# Patient Record
Sex: Male | Born: 1950 | Race: White | Hispanic: No | Marital: Married | Smoking: Never smoker
Health system: Southern US, Community
[De-identification: ages and names within clinical notes are randomized; demographics above are authoritative.]

## PROBLEM LIST (undated history)

## (undated) DIAGNOSIS — H9190 Unspecified hearing loss, unspecified ear: Secondary | ICD-10-CM

## (undated) DIAGNOSIS — M1990 Unspecified osteoarthritis, unspecified site: Secondary | ICD-10-CM

## (undated) DIAGNOSIS — C801 Malignant (primary) neoplasm, unspecified: Secondary | ICD-10-CM

## (undated) DIAGNOSIS — K219 Gastro-esophageal reflux disease without esophagitis: Secondary | ICD-10-CM

## (undated) DIAGNOSIS — I10 Essential (primary) hypertension: Secondary | ICD-10-CM

## (undated) HISTORY — PX: PROSTATE SURGERY: SHX751

## (undated) HISTORY — PX: THYROIDECTOMY: SHX17

---

## 2006-10-30 ENCOUNTER — Ambulatory Visit: Payer: Self-pay | Admitting: Endocrinology

## 2006-11-25 ENCOUNTER — Ambulatory Visit: Payer: Self-pay | Admitting: Unknown Physician Specialty

## 2006-11-25 ENCOUNTER — Other Ambulatory Visit: Payer: Self-pay

## 2006-11-28 ENCOUNTER — Ambulatory Visit: Payer: Self-pay | Admitting: Unknown Physician Specialty

## 2006-12-01 ENCOUNTER — Ambulatory Visit: Payer: Self-pay | Admitting: Oncology

## 2006-12-04 ENCOUNTER — Ambulatory Visit: Payer: Self-pay | Admitting: Oncology

## 2006-12-31 ENCOUNTER — Ambulatory Visit: Payer: Self-pay | Admitting: Oncology

## 2007-01-05 ENCOUNTER — Ambulatory Visit: Payer: Self-pay | Admitting: Oncology

## 2007-01-09 ENCOUNTER — Ambulatory Visit: Payer: Self-pay | Admitting: Oncology

## 2007-01-31 ENCOUNTER — Ambulatory Visit: Payer: Self-pay | Admitting: Oncology

## 2007-03-02 ENCOUNTER — Ambulatory Visit: Payer: Self-pay | Admitting: Oncology

## 2007-05-15 ENCOUNTER — Ambulatory Visit: Payer: Self-pay | Admitting: Oncology

## 2007-05-31 ENCOUNTER — Ambulatory Visit: Payer: Self-pay | Admitting: Oncology

## 2007-08-12 ENCOUNTER — Ambulatory Visit: Payer: Self-pay | Admitting: Oncology

## 2007-08-31 ENCOUNTER — Ambulatory Visit: Payer: Self-pay | Admitting: Oncology

## 2007-10-31 ENCOUNTER — Ambulatory Visit: Payer: Self-pay | Admitting: Oncology

## 2007-11-11 ENCOUNTER — Ambulatory Visit: Payer: Self-pay | Admitting: Oncology

## 2007-12-01 ENCOUNTER — Ambulatory Visit: Payer: Self-pay | Admitting: Oncology

## 2007-12-11 ENCOUNTER — Ambulatory Visit: Payer: Self-pay | Admitting: Unknown Physician Specialty

## 2008-01-31 ENCOUNTER — Ambulatory Visit: Payer: Self-pay | Admitting: Oncology

## 2008-02-08 ENCOUNTER — Ambulatory Visit: Payer: Self-pay | Admitting: Oncology

## 2008-03-01 ENCOUNTER — Ambulatory Visit: Payer: Self-pay | Admitting: Oncology

## 2008-05-09 ENCOUNTER — Ambulatory Visit: Payer: Self-pay | Admitting: Oncology

## 2008-05-30 ENCOUNTER — Ambulatory Visit: Payer: Self-pay | Admitting: Oncology

## 2008-07-30 ENCOUNTER — Ambulatory Visit: Payer: Self-pay | Admitting: Oncology

## 2008-08-08 ENCOUNTER — Ambulatory Visit: Payer: Self-pay | Admitting: Oncology

## 2008-08-30 ENCOUNTER — Ambulatory Visit: Payer: Self-pay | Admitting: Oncology

## 2008-11-07 ENCOUNTER — Ambulatory Visit: Payer: Self-pay | Admitting: Oncology

## 2008-11-30 ENCOUNTER — Ambulatory Visit: Payer: Self-pay | Admitting: Oncology

## 2009-01-30 ENCOUNTER — Ambulatory Visit: Payer: Self-pay | Admitting: Oncology

## 2009-02-07 ENCOUNTER — Ambulatory Visit: Payer: Self-pay | Admitting: Oncology

## 2009-03-01 ENCOUNTER — Ambulatory Visit: Payer: Self-pay | Admitting: Oncology

## 2009-08-26 IMAGING — NM NUCLEAR MEDICINE 24 HOUR THYROID UPTAKE
7 series · 26 of 26 positions shown · non-contrast
Comparison: none

REASON FOR EXAM: thyroid CA
COMMENTS:

[Series 1000: (id) wb 72 hr (reformatted series) · 2.40mm/px · 2 of 2 frames shown]
[frame 1/2  full-range]
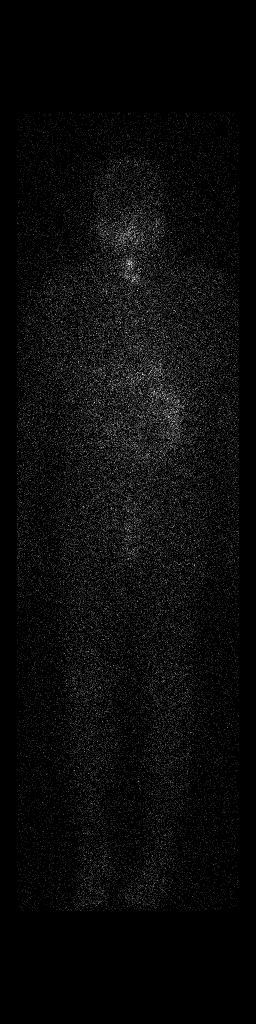
[frame 2/2  full-range]
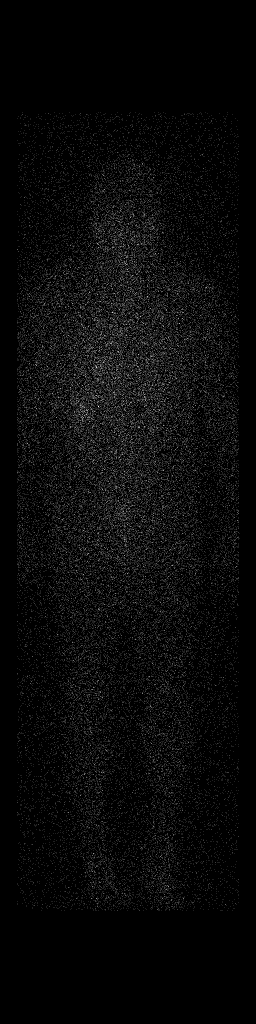

[Series 1000: (id) wb 24 hr · 2.40mm/px · 2 of 2 frames shown (1 of 2)]
[frame 1/2  full-range]
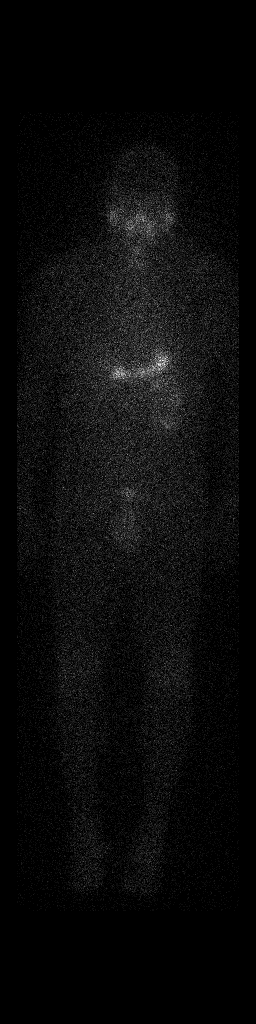
[frame 2/2  full-range]
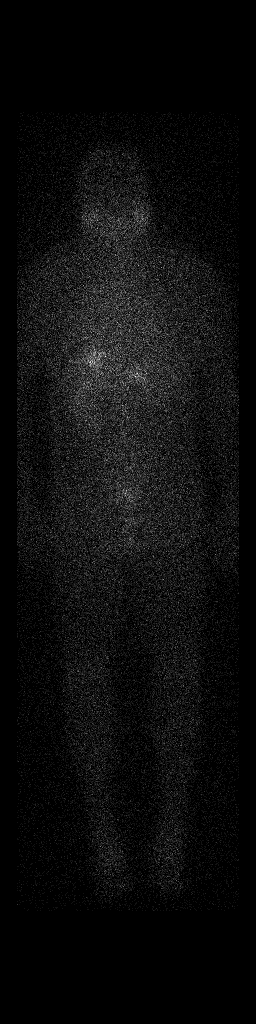

[Series 1000: (id) statics 24 hour · 2.40mm/px · 3 acquisitions, 6 frames shown (1 of 2)]
[im 1/3  full-range]
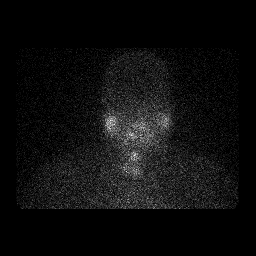
[im 1/3  full-range]
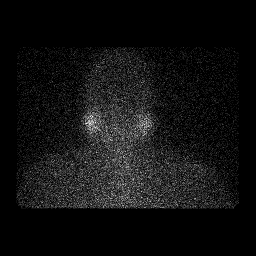
[im 2/3  full-range]
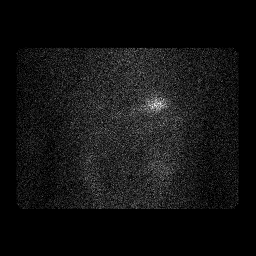
[im 2/3  full-range]
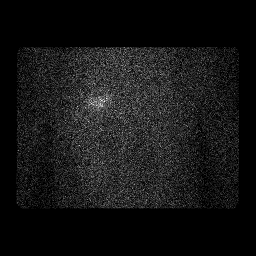
[im 3/3  full-range]
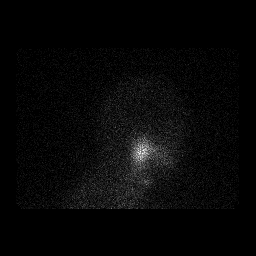
[im 3/3  full-range]
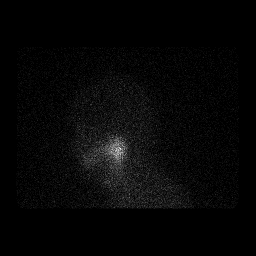

[Series 1000: (id) wb 24 hr · 2.40mm/px · 2 of 2 frames shown (2 of 2)]
[frame 1/2  full-range]
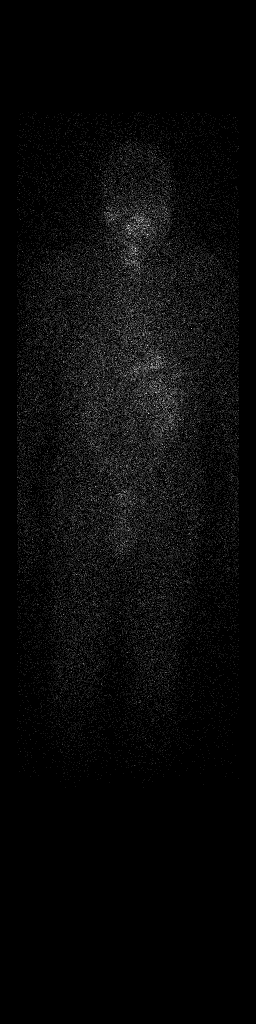
[frame 2/2  full-range]
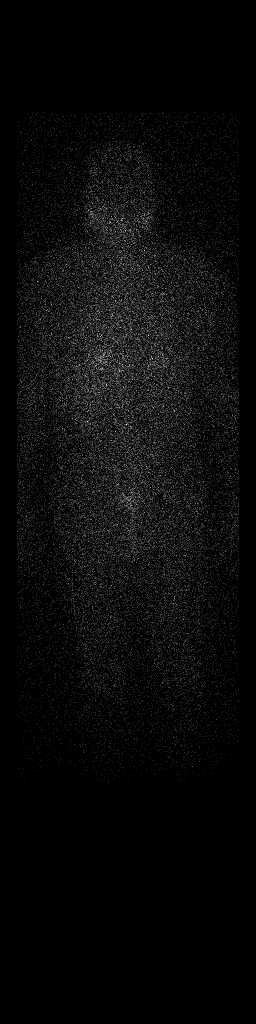

[Series 1000: (id) wb 72 hr · 2.40mm/px · 2 of 2 frames shown]
[frame 1/2  full-range]
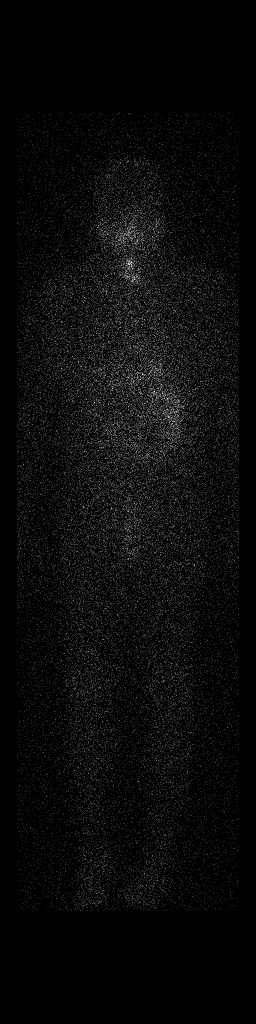
[frame 2/2  full-range]
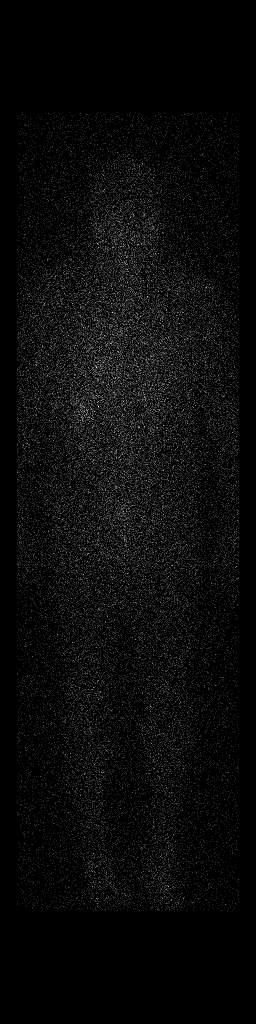

[Series 1000: (id) statics · 2.40mm/px · 4 acquisitions, 8 frames shown]
[im 1/4  full-range]
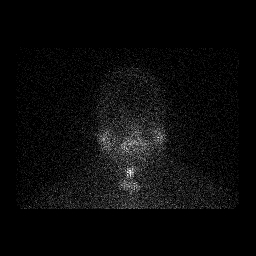
[im 1/4  full-range]
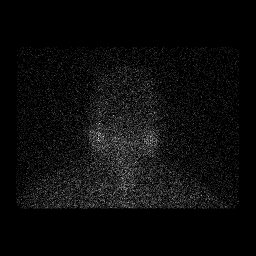
[im 2/4  full-range]
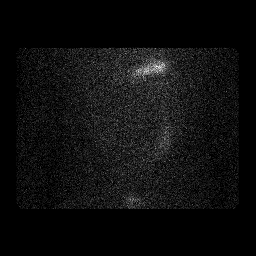
[im 2/4  full-range]
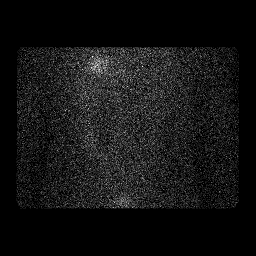
[im 3/4  full-range]
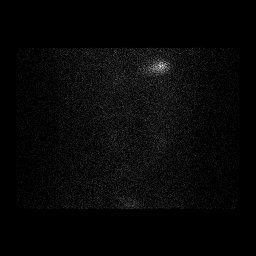
[im 3/4  full-range]
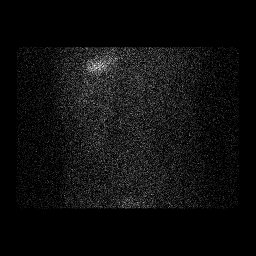
[im 4/4  full-range]
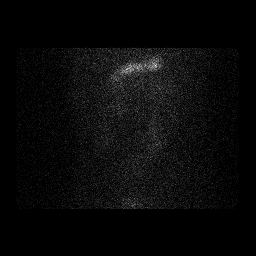
[im 4/4  full-range]
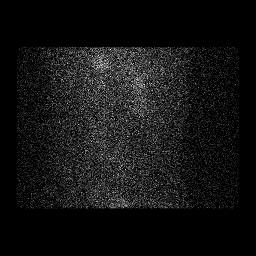

[Series 1000: (id) statics 24 hour · 2.40mm/px · 2 acquisitions, 4 frames shown (2 of 2)]
[im 1/2  full-range]
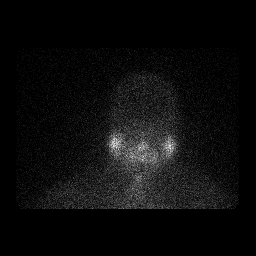
[im 1/2  full-range]
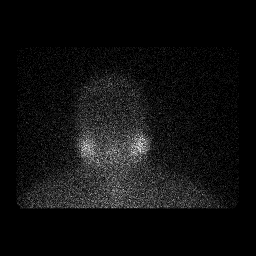
[im 2/2  full-range]
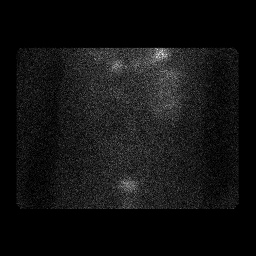
[im 2/2  full-range]
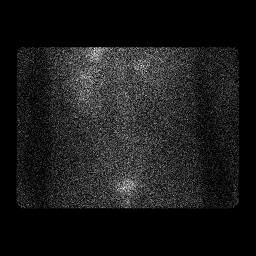

[26 of 26 positions shown; findings below may reference images not displayed]

PROCEDURE:     NM  - NM THYROID WB 72 HR [DATE]  [DATE]

RESULT:     The patient has a history of near-total thyroidectomy with two
foci of papillary carcinoma reported in the RIGHT lobe the largest of which
was 2 cm. There was no evidence of metastatic disease in the regional lymph
nodes or evidence of capsular or vascular invasion. The patient was given a
dose of 1.4 mCi of E-III. The patient's TSH level is elevated. The patient
has been on a low iodide diet.

Anterior and posterior whole-body imaging as well as regional images was
obtained at 24, 48 and 72 hours following E-III administration. The images
show residual activity in the area of the thyroid bed bilaterally. No
distant areas of abnormal localization were seen. There is normal GI and GU
localization. There is no bony localization evident.
IMPRESSION: No findings to suggest distant metastatic disease. There is
local activity in the area of the neck which could represent residual
thyroid tissue. The patient is scheduled for a therapy dose which will be
dictated separately.

## 2010-07-31 IMAGING — CT CT NECK WITH CONTRAST
1 of 2 series · 9 of 14 positions shown, 12 images · non-contrast
Comparison: none

REASON FOR EXAM: Parotitis
COMMENTS:

[Series 2: soft tissue · axial · 0.51mm/px · z∈[-358,-108]mm · 9 of 105 slices shown, 12 images]
[im 11/105  soft-tissue]
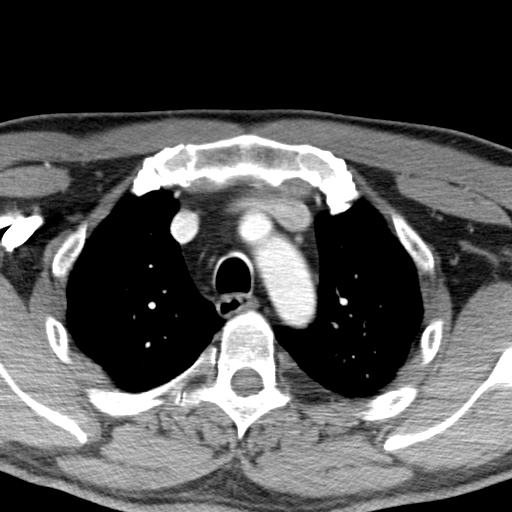
[im 11/105  bone]
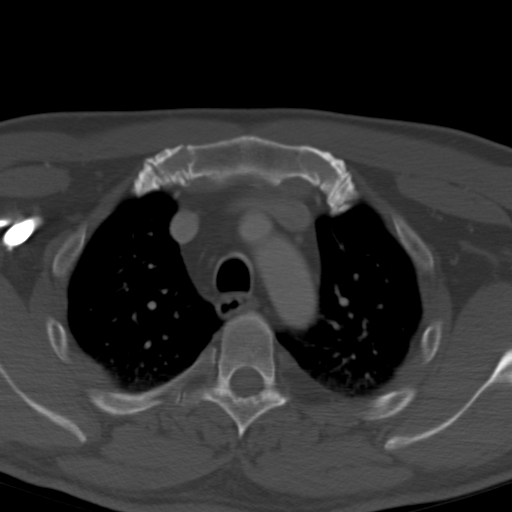
[im 21/105  bone]
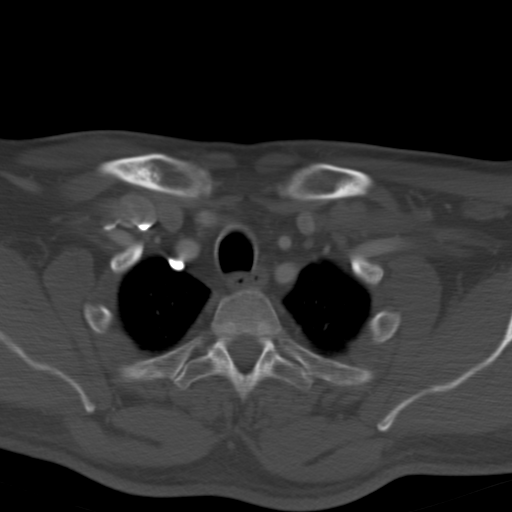
[im 32/105  bone]
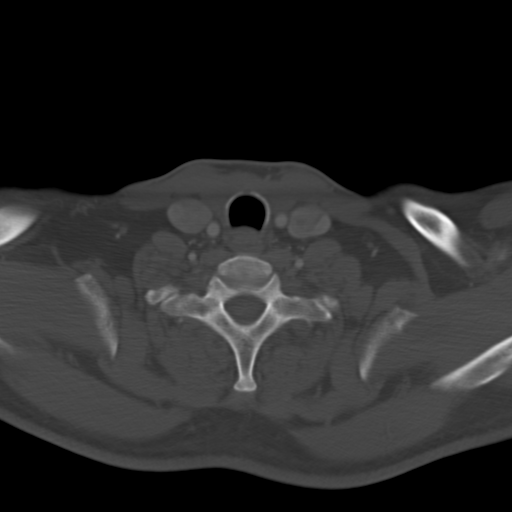
[im 42/105  bone]
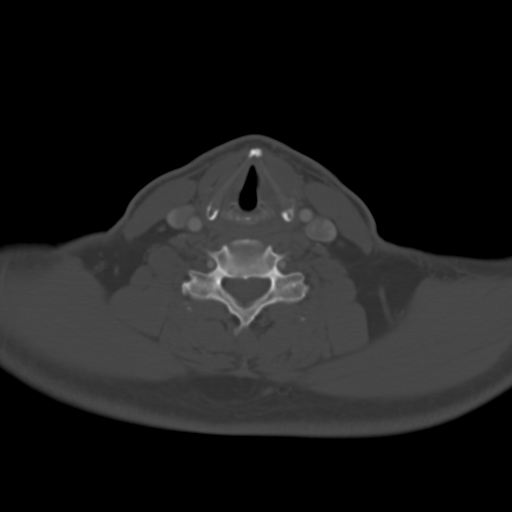
[im 53/105  soft-tissue]
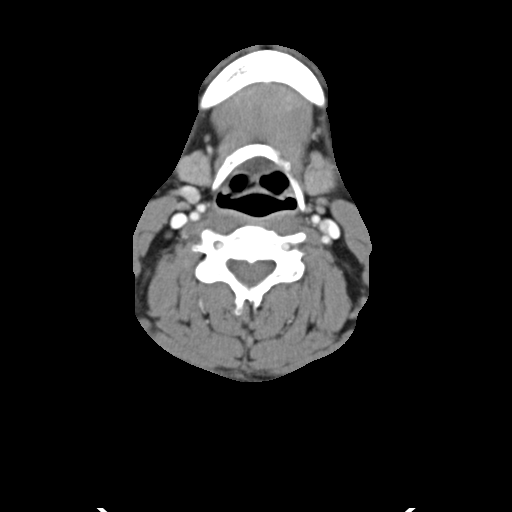
[im 53/105  bone]
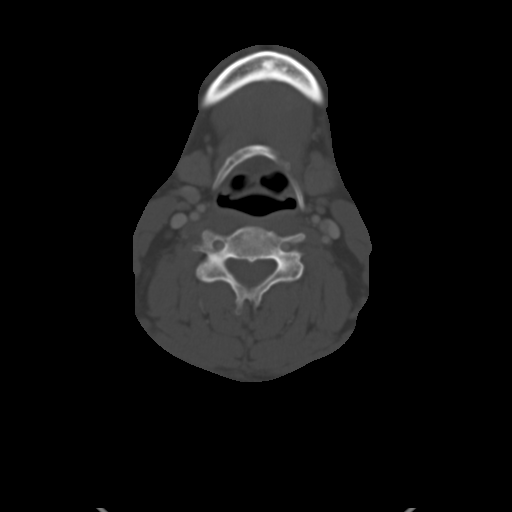
[im 63/105  bone]
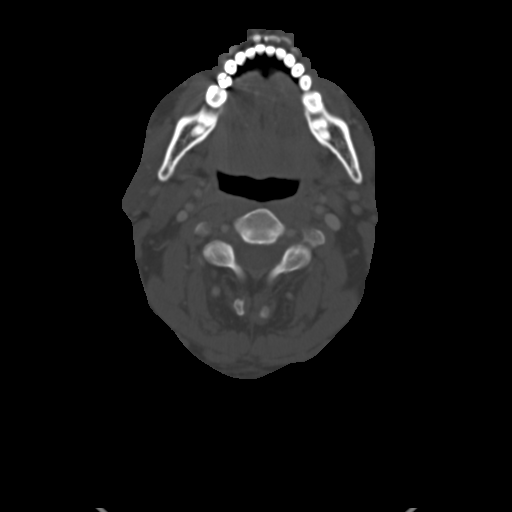
[im 73/105  bone]
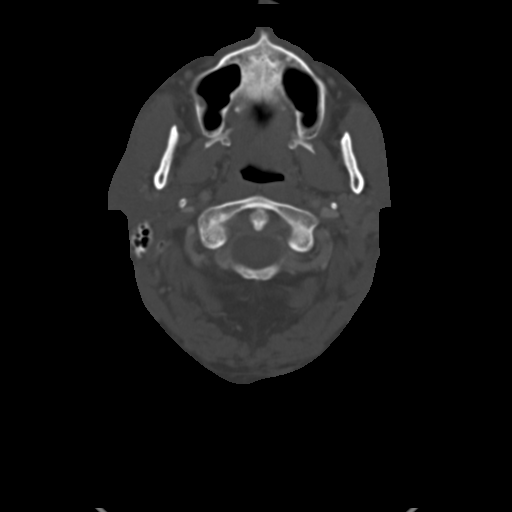
[im 84/105  bone]
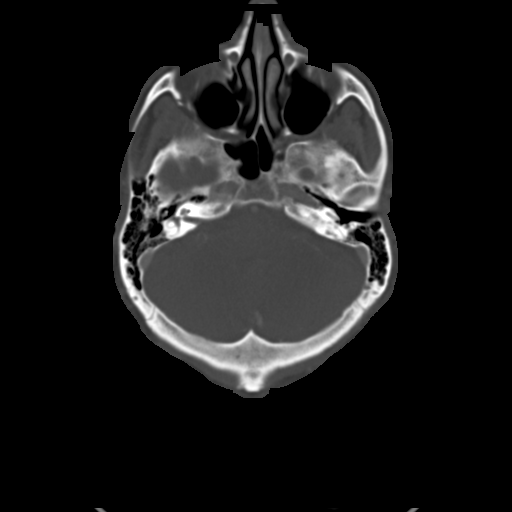
[im 94/105  soft-tissue]
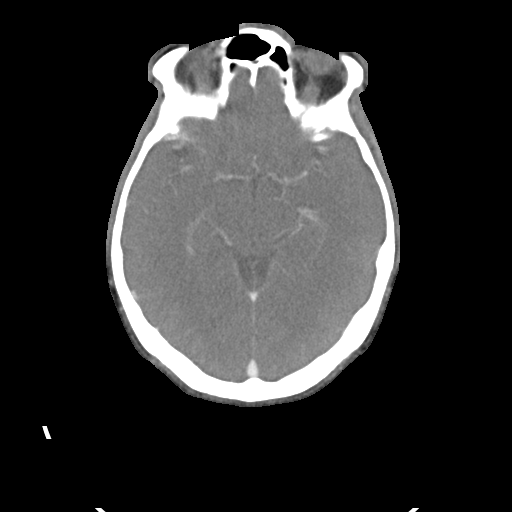
[im 94/105  bone]
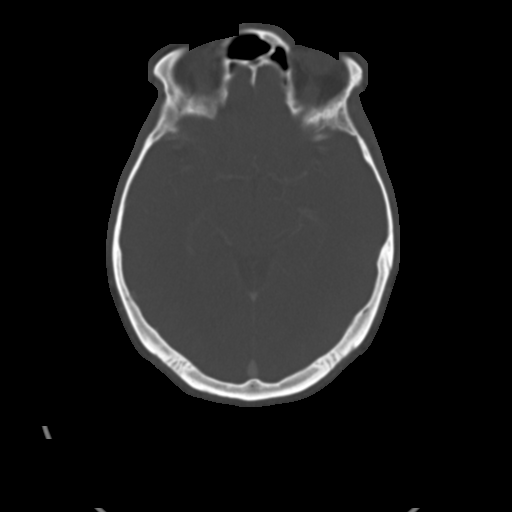

[9 of 14 positions shown; findings below may reference images not displayed]

PROCEDURE:     CT  - CT NECK WITH CONTRAST  - December 11, 2007 [DATE]

RESULT:     Helical 3 mm sections were obtained from the skull base through
the thoracic inlet status post intravenous administration of 65 ml
[id number].

Evaluation of the neck demonstrates no evidence of free fluid, drainable
loculated fluid collections,  masses nor pathologic signs or adenopathy.
Small shotty lymph nodes are identified within the anterior and posterior
cervical chains. There is an area of minimal asymmetric enhancement along
the posterior base of the parotid on the LEFT when compared to the RIGHT.
This may represent an incidental finding or may be secondary to an element
of mild inflammation. No definitive masses or nodules are identified. The
opacified vascular structures demonstrate no radiographic abnormalities.
Evaluation of the lung apices demonstrates no gross abnormalities. The
musculature of the neck appears symmetric without evidence reflecting the
sequela of infiltration or edema.
IMPRESSION: Area of mild enhancement along the posterior lower aspect
of the parotid on the LEFT.  This is of questionable clinical concern. No
further abnormalities are identified.

## 2017-01-07 ENCOUNTER — Encounter: Payer: Self-pay | Admitting: *Deleted

## 2017-01-14 ENCOUNTER — Ambulatory Visit: Payer: Medicare Other | Admitting: Anesthesiology

## 2017-01-14 ENCOUNTER — Encounter: Payer: Self-pay | Admitting: Ophthalmology

## 2017-01-14 ENCOUNTER — Encounter
Admission: RE | Disposition: A | Discharge status: Home / Self Care | Payer: Self-pay | Source: Ambulatory Visit | Attending: Ophthalmology

## 2017-01-14 ENCOUNTER — Ambulatory Visit
Admission: RE | Admit: 2017-01-14 | Discharge: 2017-01-14 | Disposition: A | Discharge status: Home / Self Care | Payer: Medicare Other | Source: Ambulatory Visit | Attending: Ophthalmology | Admitting: Ophthalmology

## 2017-01-14 DIAGNOSIS — K219 Gastro-esophageal reflux disease without esophagitis: Secondary | ICD-10-CM | POA: Insufficient documentation

## 2017-01-14 DIAGNOSIS — H2512 Age-related nuclear cataract, left eye: Secondary | ICD-10-CM | POA: Diagnosis not present

## 2017-01-14 DIAGNOSIS — I10 Essential (primary) hypertension: Secondary | ICD-10-CM | POA: Insufficient documentation

## 2017-01-14 DIAGNOSIS — E890 Postprocedural hypothyroidism: Secondary | ICD-10-CM | POA: Insufficient documentation

## 2017-01-14 DIAGNOSIS — Z885 Allergy status to narcotic agent status: Secondary | ICD-10-CM | POA: Insufficient documentation

## 2017-01-14 DIAGNOSIS — Z79899 Other long term (current) drug therapy: Secondary | ICD-10-CM | POA: Insufficient documentation

## 2017-01-14 DIAGNOSIS — Z85850 Personal history of malignant neoplasm of thyroid: Secondary | ICD-10-CM | POA: Insufficient documentation

## 2017-01-14 DIAGNOSIS — M1990 Unspecified osteoarthritis, unspecified site: Secondary | ICD-10-CM | POA: Diagnosis not present

## 2017-01-14 HISTORY — DX: Malignant (primary) neoplasm, unspecified: C80.1

## 2017-01-14 HISTORY — DX: Essential (primary) hypertension: I10

## 2017-01-14 HISTORY — DX: Unspecified osteoarthritis, unspecified site: M19.90

## 2017-01-14 HISTORY — DX: Unspecified hearing loss, unspecified ear: H91.90

## 2017-01-14 HISTORY — PX: CATARACT EXTRACTION W/PHACO: SHX586

## 2017-01-14 HISTORY — DX: Gastro-esophageal reflux disease without esophagitis: K21.9

## 2017-01-14 SURGERY — PHACOEMULSIFICATION, CATARACT, WITH IOL INSERTION
Anesthesia: Monitor Anesthesia Care | Site: Eye | Laterality: Left | Wound class: Clean

## 2017-01-14 MED ORDER — POVIDONE-IODINE 5 % OP SOLN
OPHTHALMIC | Status: DC | PRN
  Administered 2017-01-14: 1 via OPHTHALMIC

## 2017-01-14 MED ORDER — NA CHONDROIT SULF-NA HYALURON 40-17 MG/ML IO SOLN
INTRAOCULAR | Status: AC
  Filled 2017-01-14: qty 1

## 2017-01-14 MED ORDER — LIDOCAINE HCL (PF) 4 % IJ SOLN
INTRAMUSCULAR | Status: AC
  Filled 2017-01-14: qty 5

## 2017-01-14 MED ORDER — SODIUM CHLORIDE 0.9 % IV SOLN
INTRAVENOUS | Status: DC
  Administered 2017-01-14 (×2): via INTRAVENOUS

## 2017-01-14 MED ORDER — LIDOCAINE HCL (PF) 4 % IJ SOLN
INTRAOCULAR | Status: DC | PRN
  Administered 2017-01-14: 2 mL via OPHTHALMIC

## 2017-01-14 MED ORDER — NA CHONDROIT SULF-NA HYALURON 40-17 MG/ML IO SOLN
INTRAOCULAR | Status: DC | PRN
  Administered 2017-01-14: 1 mL via INTRAOCULAR

## 2017-01-14 MED ORDER — MIDAZOLAM HCL 5 MG/5ML IJ SOLN
INTRAMUSCULAR | Status: DC | PRN
  Administered 2017-01-14: 2 mg via INTRAVENOUS

## 2017-01-14 MED ORDER — MOXIFLOXACIN HCL 0.5 % OP SOLN
OPHTHALMIC | Status: AC
  Filled 2017-01-14: qty 3

## 2017-01-14 MED ORDER — EPINEPHRINE PF 1 MG/ML IJ SOLN
INTRAOCULAR | Status: DC | PRN
  Administered 2017-01-14: 1 mL via OPHTHALMIC

## 2017-01-14 MED ORDER — PROPOFOL 10 MG/ML IV BOLUS
INTRAVENOUS | Status: AC
Start: 2017-01-14 — End: 2017-01-14
  Filled 2017-01-14: qty 20

## 2017-01-14 MED ORDER — EPINEPHRINE PF 1 MG/ML IJ SOLN
INTRAMUSCULAR | Status: AC
  Filled 2017-01-14: qty 1

## 2017-01-14 MED ORDER — MIDAZOLAM HCL 2 MG/2ML IJ SOLN
INTRAMUSCULAR | Status: AC
  Filled 2017-01-14: qty 2

## 2017-01-14 MED ORDER — MOXIFLOXACIN HCL 0.5 % OP SOLN
OPHTHALMIC | Status: DC | PRN
  Administered 2017-01-14: .2 mL via OPHTHALMIC

## 2017-01-14 MED ORDER — CARBACHOL 0.01 % IO SOLN
INTRAOCULAR | Status: DC | PRN
  Administered 2017-01-14: .5 mL via INTRAOCULAR

## 2017-01-14 MED ORDER — MOXIFLOXACIN HCL 0.5 % OP SOLN: 1.0000 [drp] | OPHTHALMIC | Status: DC | PRN

## 2017-01-14 MED ORDER — ARMC OPHTHALMIC DILATING DROPS
OPHTHALMIC | Status: AC
  Administered 2017-01-14: 1 via OPHTHALMIC
  Filled 2017-01-14: qty 0.4

## 2017-01-14 MED ORDER — ARMC OPHTHALMIC DILATING DROPS
1.0000 "application " | OPHTHALMIC | Status: AC
  Administered 2017-01-14 (×3): 1 via OPHTHALMIC

## 2017-01-14 SURGICAL SUPPLY — 16 items
GLOVE BIO SURGEON STRL SZ8 (GLOVE) ×3 IMPLANT
GLOVE BIOGEL M 6.5 STRL (GLOVE) ×3 IMPLANT
GLOVE SURG LX 8.0 MICRO (GLOVE) ×2
GLOVE SURG LX STRL 8.0 MICRO (GLOVE) ×1 IMPLANT
GOWN STRL REUS W/ TWL LRG LVL3 (GOWN DISPOSABLE) ×2 IMPLANT
GOWN STRL REUS W/TWL LRG LVL3 (GOWN DISPOSABLE) ×4
LABEL CATARACT MEDS ST (LABEL) ×3 IMPLANT
LENS IOL TECNIS ITEC 21.0 (Intraocular Lens) ×3 IMPLANT
PACK CATARACT (MISCELLANEOUS) ×3 IMPLANT
PACK CATARACT BRASINGTON LX (MISCELLANEOUS) ×3 IMPLANT
PACK EYE AFTER SURG (MISCELLANEOUS) ×3 IMPLANT
SOL BSS BAG (MISCELLANEOUS) ×3
SOLUTION BSS BAG (MISCELLANEOUS) ×1 IMPLANT
SYR 5ML LL (SYRINGE) ×3 IMPLANT
WATER STERILE IRR 250ML POUR (IV SOLUTION) ×3 IMPLANT
WIPE NON LINTING 3.25X3.25 (MISCELLANEOUS) ×3 IMPLANT

## 2017-01-14 NOTE — Op Note (Signed)
PREOPERATIVE DIAGNOSIS:  Nuclear sclerotic cataract of the left eye.   POSTOPERATIVE DIAGNOSIS:  Nuclear sclerotic cataract of the left eye.   OPERATIVE PROCEDURE: Procedure(s): CATARACT EXTRACTION PHACO AND INTRAOCULAR LENS PLACEMENT (IOC)   SURGEON:  Birder Robson, MD.   ANESTHESIA:  Anesthesiologist: Gunnar Bulla, MD CRNA: Silvana Newness, CRNA  1.      Managed anesthesia care. 2.     0.34ml of Shugarcaine was instilled following the paracentesis   COMPLICATIONS:  None.   TECHNIQUE:   Stop and chop   DESCRIPTION OF PROCEDURE:  The patient was examined and consented in the preoperative holding area where the aforementioned topical anesthesia was applied to the left eye and then brought back to the Operating Room where the left eye was prepped and draped in the usual sterile ophthalmic fashion and a lid speculum was placed. A paracentesis was created with the side port blade and the anterior chamber was filled with viscoelastic. A near clear corneal incision was performed with the steel keratome. A continuous curvilinear capsulorrhexis was performed with a cystotome followed by the capsulorrhexis forceps. Hydrodissection and hydrodelineation were carried out with BSS on a blunt cannula. The lens was removed in a stop and chop  technique and the remaining cortical material was removed with the irrigation-aspiration handpiece. The capsular bag was inflated with viscoelastic and the Technis [id number] lens was placed in the capsular bag without complication. The remaining viscoelastic was removed from the eye with the irrigation-aspiration handpiece. The wounds were hydrated. The anterior chamber was flushed with Miostat and the eye was inflated to physiologic pressure. 0.60ml Vigamox was placed in the anterior chamber. The wounds were found to be water tight. The eye was dressed with Vigamox. The patient was given protective glasses to wear throughout the day and a shield with which to sleep tonight.  The patient was also given drops with which to begin a drop regimen today and will follow-up with me in one day.  Implant Name Type Inv. Item Serial No. Manufacturer Lot No. LRB No. Used  LENS IOL DIOP 21.0 - [id number] Intraocular Lens LENS IOL DIOP 21.0 [id number] AMO   Left 1    Procedure(s) with comments: CATARACT EXTRACTION PHACO AND INTRAOCULAR LENS PLACEMENT (IOC) (Left) - [place] 00:48 AP% 14.7 CDE 7.09 Flyuid pack lot # [id number]  Electronically signed: [place] 01/14/2017 8:23 AM

## 2017-01-14 NOTE — Anesthesia Post-op Follow-up Note (Signed)
Anesthesia QCDR form completed.        

## 2017-01-14 NOTE — Transfer of Care (Signed)
Immediate Anesthesia Transfer of Care Note  Patient: Jon Moody  Procedure(s) Performed: CATARACT EXTRACTION PHACO AND INTRAOCULAR LENS PLACEMENT (IOC) (Left Eye)  Patient Location: PACU  Anesthesia Type:MAC  Level of Consciousness: awake, alert , oriented and patient cooperative  Airway & Oxygen Therapy: Patient Spontanous Breathing  Post-op Assessment: Report given to RN, Post -op Vital signs reviewed and stable and Patient moving all extremities X 4  Post vital signs: Reviewed and stable  Last Vitals:  Vitals:   01/14/17 0635 01/14/17 0826  BP: 128/80 116/75  Pulse: 61   Resp: 18   Temp: (!) 35.7 C 36.5 C  SpO2: 97% 97%    Last Pain:  Vitals:   01/14/17 0635  TempSrc: Tympanic         Complications: No apparent anesthesia complications

## 2017-01-14 NOTE — Anesthesia Postprocedure Evaluation (Signed)
Anesthesia Post Note  Patient: Jon Moody  Procedure(s) Performed: CATARACT EXTRACTION PHACO AND INTRAOCULAR LENS PLACEMENT (IOC) (Left Eye)  Patient location during evaluation: PACU Anesthesia Type: MAC Level of consciousness: awake and alert Pain management: pain level controlled Vital Signs Assessment: post-procedure vital signs reviewed and stable Respiratory status: spontaneous breathing, nonlabored ventilation and respiratory function stable Cardiovascular status: stable and blood pressure returned to baseline Postop Assessment: no apparent nausea or vomiting Anesthetic complications: no     Last Vitals:  Vitals:   01/14/17 0635 01/14/17 0826  BP: 128/80 116/75  Pulse: 61   Resp: 18   Temp: (!) 35.7 C 36.5 C  SpO2: 97% 97%    Last Pain:  Vitals:   01/14/17 0635  TempSrc: Tympanic                 Silvana Newness A

## 2017-01-14 NOTE — Discharge Instructions (Signed)
Eye Surgery Discharge Instructions  Expect mild scratchy sensation or mild soreness. DO NOT RUB YOUR EYE!  The day of surgery:  Minimal physical activity, but bed rest is not required  No reading, computer work, or close hand work  No bending, lifting, or straining.  May watch TV  For 24 hours:  No driving, legal decisions, or alcoholic beverages  Safety precautions  Eat anything you prefer: It is better to start with liquids, then soup then solid foods.  _____ Eye patch should be worn until postoperative exam tomorrow.  ____ Solar shield eyeglasses should be worn for comfort in the sunlight/patch while sleeping  Resume all regular medications including aspirin or Coumadin if these were discontinued prior to surgery. You may shower, bathe, shave, or wash your hair. Tylenol may be taken for mild discomfort.  Call your doctor if you experience significant pain, nausea, or vomiting, fever > 101 or other signs of infection. [phone] or [phone] Specific instructions:  Follow-up Information    Birder Robson, MD Follow up on 01/15/2017.   Specialty:  Ophthalmology Why:  9:50 Contact information: [address] [place] [address] [phone]

## 2017-01-14 NOTE — Anesthesia Preprocedure Evaluation (Signed)
Anesthesia Evaluation  Patient identified by MRN, date of birth, ID band Patient awake    Reviewed: Allergy & Precautions, NPO status , Patient's Chart, lab work & pertinent test results, reviewed documented beta blocker date and time   Airway Mallampati: II  TM Distance: >3 FB     Dental  (+) Chipped   Pulmonary           Cardiovascular hypertension, Pt. on medications      Neuro/Psych    GI/Hepatic GERD  Controlled,  Endo/Other    Renal/GU      Musculoskeletal  (+) Arthritis ,   Abdominal   Peds  Hematology   Anesthesia Other Findings Gout.  Reproductive/Obstetrics                             Anesthesia Physical Anesthesia Plan  ASA: III  Anesthesia Plan: MAC   Post-op Pain Management:    Induction:   PONV Risk Score and Plan:   Airway Management Planned:   Additional Equipment:   Intra-op Plan:   Post-operative Plan:   Informed Consent: I have reviewed the patients History and Physical, chart, labs and discussed the procedure including the risks, benefits and alternatives for the proposed anesthesia with the patient or authorized representative who has indicated his/her understanding and acceptance.     Plan Discussed with: CRNA  Anesthesia Plan Comments:         Anesthesia Quick Evaluation

## 2017-01-14 NOTE — H&P (Signed)
All labs reviewed. Abnormal studies sent to patients PCP when indicated.  Previous H&P reviewed, patient examined, there are NO CHANGES.  Jon Moody LOUIS10/16/20187:22 AM

## 2017-01-14 NOTE — OR Nursing (Signed)
Spoke with Dr Marcello Moores in anesthesia about patients metoprolol. Took medication last night. Per MD does not need dose this morning.
